# Patient Record
Sex: Female | Born: 1995 | Race: Black or African American | Hispanic: No | Marital: Married
Health system: Southern US, Community
[De-identification: ages and names within clinical notes are randomized; demographics above are authoritative.]

## PROBLEM LIST (undated history)

## (undated) DIAGNOSIS — K219 Gastro-esophageal reflux disease without esophagitis: Secondary | ICD-10-CM

---
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
## 2016-05-15 DIAGNOSIS — H04123 Dry eye syndrome of bilateral lacrimal glands: Secondary | ICD-10-CM | POA: Diagnosis not present

## 2016-05-15 DIAGNOSIS — G44219 Episodic tension-type headache, not intractable: Secondary | ICD-10-CM | POA: Diagnosis not present

## 2016-05-30 DIAGNOSIS — H578 Other specified disorders of eye and adnexa: Secondary | ICD-10-CM | POA: Diagnosis not present

## 2016-07-07 DIAGNOSIS — Z23 Encounter for immunization: Secondary | ICD-10-CM | POA: Diagnosis not present

## 2016-07-07 DIAGNOSIS — N946 Dysmenorrhea, unspecified: Secondary | ICD-10-CM | POA: Diagnosis not present

## 2016-07-07 DIAGNOSIS — J309 Allergic rhinitis, unspecified: Secondary | ICD-10-CM | POA: Diagnosis not present

## 2016-09-03 DIAGNOSIS — H04123 Dry eye syndrome of bilateral lacrimal glands: Secondary | ICD-10-CM | POA: Diagnosis not present

## 2016-09-23 DIAGNOSIS — Z23 Encounter for immunization: Secondary | ICD-10-CM | POA: Diagnosis not present

## 2017-01-20 DIAGNOSIS — Z0000 Encounter for general adult medical examination without abnormal findings: Secondary | ICD-10-CM | POA: Diagnosis not present

## 2017-01-22 DIAGNOSIS — Z23 Encounter for immunization: Secondary | ICD-10-CM | POA: Diagnosis not present

## 2017-01-22 DIAGNOSIS — Z0000 Encounter for general adult medical examination without abnormal findings: Secondary | ICD-10-CM | POA: Diagnosis not present

## 2018-01-24 DIAGNOSIS — Z0000 Encounter for general adult medical examination without abnormal findings: Secondary | ICD-10-CM | POA: Diagnosis not present

## 2018-01-26 DIAGNOSIS — Z01419 Encounter for gynecological examination (general) (routine) without abnormal findings: Secondary | ICD-10-CM | POA: Diagnosis not present

## 2018-01-26 DIAGNOSIS — H6692 Otitis media, unspecified, left ear: Secondary | ICD-10-CM | POA: Diagnosis not present

## 2018-01-26 DIAGNOSIS — N946 Dysmenorrhea, unspecified: Secondary | ICD-10-CM | POA: Diagnosis not present

## 2018-01-26 DIAGNOSIS — Z118 Encounter for screening for other infectious and parasitic diseases: Secondary | ICD-10-CM | POA: Diagnosis not present

## 2018-01-26 DIAGNOSIS — Z6836 Body mass index (BMI) 36.0-36.9, adult: Secondary | ICD-10-CM | POA: Diagnosis not present

## 2018-01-26 DIAGNOSIS — N760 Acute vaginitis: Secondary | ICD-10-CM | POA: Diagnosis not present

## 2018-01-26 DIAGNOSIS — Z23 Encounter for immunization: Secondary | ICD-10-CM | POA: Diagnosis not present

## 2018-01-26 DIAGNOSIS — J309 Allergic rhinitis, unspecified: Secondary | ICD-10-CM | POA: Diagnosis not present

## 2018-01-26 DIAGNOSIS — Z0000 Encounter for general adult medical examination without abnormal findings: Secondary | ICD-10-CM | POA: Diagnosis not present

## 2018-05-19 DIAGNOSIS — N939 Abnormal uterine and vaginal bleeding, unspecified: Secondary | ICD-10-CM | POA: Diagnosis not present

## 2018-05-19 DIAGNOSIS — Z6835 Body mass index (BMI) 35.0-35.9, adult: Secondary | ICD-10-CM | POA: Diagnosis not present

## 2018-06-11 DIAGNOSIS — H40033 Anatomical narrow angle, bilateral: Secondary | ICD-10-CM | POA: Diagnosis not present

## 2018-06-11 DIAGNOSIS — H04123 Dry eye syndrome of bilateral lacrimal glands: Secondary | ICD-10-CM | POA: Diagnosis not present

## 2018-08-18 DIAGNOSIS — N946 Dysmenorrhea, unspecified: Secondary | ICD-10-CM | POA: Diagnosis not present

## 2018-08-18 DIAGNOSIS — Z719 Counseling, unspecified: Secondary | ICD-10-CM | POA: Diagnosis not present

## 2019-03-10 ENCOUNTER — Other Ambulatory Visit: Payer: Self-pay | Admitting: Family Medicine

## 2019-03-10 DIAGNOSIS — K8020 Calculus of gallbladder without cholecystitis without obstruction: Secondary | ICD-10-CM

## 2019-04-19 DIAGNOSIS — Z20828 Contact with and (suspected) exposure to other viral communicable diseases: Secondary | ICD-10-CM | POA: Diagnosis not present

## 2019-04-19 DIAGNOSIS — U071 COVID-19: Secondary | ICD-10-CM | POA: Diagnosis not present

## 2019-04-19 DIAGNOSIS — Z119 Encounter for screening for infectious and parasitic diseases, unspecified: Secondary | ICD-10-CM | POA: Diagnosis not present

## 2019-04-24 ENCOUNTER — Other Ambulatory Visit: Payer: Commercial Managed Care - HMO

## 2019-05-01 DIAGNOSIS — Z6836 Body mass index (BMI) 36.0-36.9, adult: Secondary | ICD-10-CM | POA: Diagnosis not present

## 2019-05-01 DIAGNOSIS — U071 COVID-19: Secondary | ICD-10-CM | POA: Diagnosis not present

## 2019-05-19 DIAGNOSIS — N946 Dysmenorrhea, unspecified: Secondary | ICD-10-CM | POA: Diagnosis not present

## 2019-05-19 DIAGNOSIS — F3281 Premenstrual dysphoric disorder: Secondary | ICD-10-CM | POA: Diagnosis not present

## 2019-05-19 DIAGNOSIS — K219 Gastro-esophageal reflux disease without esophagitis: Secondary | ICD-10-CM | POA: Diagnosis not present

## 2019-07-29 DIAGNOSIS — Z0000 Encounter for general adult medical examination without abnormal findings: Secondary | ICD-10-CM | POA: Diagnosis not present

## 2019-07-29 DIAGNOSIS — Z0184 Encounter for antibody response examination: Secondary | ICD-10-CM | POA: Diagnosis not present

## 2019-07-29 DIAGNOSIS — Z13220 Encounter for screening for lipoid disorders: Secondary | ICD-10-CM | POA: Diagnosis not present

## 2019-08-02 ENCOUNTER — Other Ambulatory Visit: Payer: Self-pay | Admitting: Family Medicine

## 2019-08-02 DIAGNOSIS — R109 Unspecified abdominal pain: Secondary | ICD-10-CM

## 2019-08-02 DIAGNOSIS — Z0000 Encounter for general adult medical examination without abnormal findings: Secondary | ICD-10-CM | POA: Diagnosis not present

## 2019-08-02 DIAGNOSIS — Z01419 Encounter for gynecological examination (general) (routine) without abnormal findings: Secondary | ICD-10-CM | POA: Diagnosis not present

## 2019-08-02 DIAGNOSIS — R7989 Other specified abnormal findings of blood chemistry: Secondary | ICD-10-CM

## 2019-08-02 DIAGNOSIS — R1030 Lower abdominal pain, unspecified: Secondary | ICD-10-CM | POA: Diagnosis not present

## 2019-08-02 DIAGNOSIS — Z6835 Body mass index (BMI) 35.0-35.9, adult: Secondary | ICD-10-CM | POA: Diagnosis not present

## 2019-08-08 ENCOUNTER — Ambulatory Visit
Admission: RE | Admit: 2019-08-08 | Discharge: 2019-08-08 | Disposition: A | Discharge status: Home / Self Care | Payer: BLUE CROSS/BLUE SHIELD | Source: Ambulatory Visit | Attending: Family Medicine | Admitting: Family Medicine

## 2019-08-08 DIAGNOSIS — R109 Unspecified abdominal pain: Secondary | ICD-10-CM

## 2019-08-08 DIAGNOSIS — K8020 Calculus of gallbladder without cholecystitis without obstruction: Secondary | ICD-10-CM | POA: Diagnosis not present

## 2019-08-08 DIAGNOSIS — R7989 Other specified abnormal findings of blood chemistry: Secondary | ICD-10-CM

## 2019-08-11 DIAGNOSIS — K219 Gastro-esophageal reflux disease without esophagitis: Secondary | ICD-10-CM | POA: Diagnosis not present

## 2019-08-11 DIAGNOSIS — R945 Abnormal results of liver function studies: Secondary | ICD-10-CM | POA: Diagnosis not present

## 2019-08-11 DIAGNOSIS — R1030 Lower abdominal pain, unspecified: Secondary | ICD-10-CM | POA: Diagnosis not present

## 2019-08-31 DIAGNOSIS — K8010 Calculus of gallbladder with chronic cholecystitis without obstruction: Secondary | ICD-10-CM | POA: Diagnosis not present

## 2019-10-09 NOTE — Patient Instructions (Signed)
DUE TO COVID-19 ONLY ONE VISITOR IS ALLOWED TO COME WITH YOU AND STAY IN THE WAITING ROOM ONLY DURING PRE OP AND PROCEDURE DAY OF SURGERY. THE 1 VISITOR MAY VISIT WITH YOU AFTER SURGERY IN YOUR PRIVATE ROOM DURING VISITING HOURS ONLY!  YOU NEED TO HAVE A COVID 19 TEST ON__6/29_____ @_10 :45______, THIS TEST MUST BE DONE BEFORE SURGERY, COME  [address], [place] , [address].  ([place]) ONCE YOUR COVID TEST IS COMPLETED, PLEASE BEGIN THE QUARANTINE INSTRUCTIONS AS OUTLINED IN YOUR HANDOUT.                Kaena Stamour    Your procedure is scheduled on: 10/20/19   Report to [facility] Main  Entrance   Report to admitting at 7:30 AM     Call this number if you have problems the morning of surgery [phone]    Remember: Do not eat food or drink liquids :After Midnight.   BRUSH YOUR TEETH MORNING OF SURGERY AND RINSE YOUR MOUTH OUT, NO CHEWING GUM CANDY OR MINTS.     Take these medicines the morning of surgery with A SIP OF WATER: None                                 You may not have any metal on your body including hair pins and              piercings  Do not wear jewelry, make-up, lotions, powders or perfumes, deodorant             Do not wear nail polish on your fingernails.  Do not shave  48 hours prior to surgery.                 Do not bring valuables to the hospital. [place].  Contacts, dentures or bridgework may not be worn into surgery.      Patients discharged the day of surgery will not be allowed to drive home.  IF YOU ARE HAVING SURGERY AND GOING HOME THE SAME DAY, YOU MUST HAVE AN ADULT TO DRIVE YOU HOME AND BE WITH YOU FOR 24 HOURS  YOU MAY GO HOME BY TAXI OR UBER OR ORTHERWISE, BUT AN ADULT MUST ACCOMPANY YOU HOME AND STAY WITH YOU FOR 24 HOURS.  Name and phone number of your driver:  Special Instructions: N/A              Please read over the following fact sheets you were  given: _____________________________________________________________________             [facility] - Preparing for Surgery Before surgery, you can play an important role.   Because skin is not sterile, your skin needs to be as free of germs as possible .  You can reduce the number of germs on your skin by washing with CHG (chlorahexidine gluconate) soap before surgery .  CHG is an antiseptic cleaner which kills germs and bonds with the skin to continue killing germs even after washing. Please DO NOT use if you have an allergy to CHG or antibacterial soaps.   If your skin becomes reddened/irritated stop using the CHG and inform your nurse when you arrive at Short Stay. Do not shave (including legs and underarms) for at least 48 hours prior to  the first CHG shower.   Please follow these instructions carefully:  1.  Shower with CHG Soap the night before surgery and the  morning of Surgery.  2.  If you choose to wash your hair, wash your hair first as usual with your  normal  shampoo.  3.  After you shampoo, rinse your hair and body thoroughly to remove the  shampoo.                                        4.  Use CHG as you would any other liquid soap.  You can apply chg directly  to the skin and wash                       Gently with a scrungie or clean washcloth.  5.  Apply the CHG Soap to your body ONLY FROM THE NECK DOWN.   Do not use on face/ open                           Wound or open sores. Avoid contact with eyes, ears mouth and genitals (private parts).                       Wash face,  Genitals (private parts) with your normal soap.             6.  Wash thoroughly, paying special attention to the area where your surgery  will be performed.  7.  Thoroughly rinse your body with warm water from the neck down.  8.  DO NOT shower/wash with your normal soap after using and rinsing off  the CHG Soap.             9.  Pat yourself dry with a clean towel.            10.  Wear clean  pajamas.            11.  Place clean sheets on your bed the night of your first shower and do not  sleep with pets. Day of Surgery : Do not apply any lotions/deodorants the morning of surgery.  Please wear clean clothes to the hospital/surgery center.  FAILURE TO FOLLOW THESE INSTRUCTIONS MAY RESULT IN THE CANCELLATION OF YOUR SURGERY PATIENT SIGNATURE_________________________________  NURSE SIGNATURE__________________________________  ________________________________________________________________________

## 2019-10-10 ENCOUNTER — Ambulatory Visit (HOSPITAL_COMMUNITY): Payer: Self-pay | Admitting: Surgery

## 2019-10-10 ENCOUNTER — Encounter (HOSPITAL_COMMUNITY)
Admission: RE | Admit: 2019-10-10 | Discharge: 2019-10-10 | Disposition: A | Discharge status: Home / Self Care | Payer: BC Managed Care – PPO | Source: Ambulatory Visit | Attending: Surgery | Admitting: Surgery

## 2019-10-10 ENCOUNTER — Encounter (HOSPITAL_COMMUNITY): Payer: Self-pay | Admitting: Surgery

## 2019-10-10 ENCOUNTER — Other Ambulatory Visit: Payer: Self-pay

## 2019-10-10 NOTE — Progress Notes (Signed)
Patient is a Jehovah Witness.

## 2019-10-10 NOTE — Progress Notes (Signed)
Spoke with Triage at [facility] and they will inform Dr Daphine Deutscher to place preop orders.

## 2019-10-17 ENCOUNTER — Other Ambulatory Visit: Payer: Self-pay

## 2019-10-17 ENCOUNTER — Other Ambulatory Visit (HOSPITAL_COMMUNITY)
Admission: RE | Admit: 2019-10-17 | Discharge: 2019-10-17 | Disposition: A | Discharge status: Home / Self Care | Payer: BC Managed Care – PPO | Source: Ambulatory Visit | Attending: Surgery | Admitting: Surgery

## 2019-10-17 ENCOUNTER — Encounter (HOSPITAL_COMMUNITY)
Admission: RE | Admit: 2019-10-17 | Discharge: 2019-10-17 | Disposition: A | Discharge status: Home / Self Care | Payer: BC Managed Care – PPO | Source: Ambulatory Visit | Attending: Surgery | Admitting: Surgery

## 2019-10-17 DIAGNOSIS — Z01812 Encounter for preprocedural laboratory examination: Secondary | ICD-10-CM | POA: Diagnosis not present

## 2019-10-17 DIAGNOSIS — Z20822 Contact with and (suspected) exposure to covid-19: Secondary | ICD-10-CM | POA: Insufficient documentation

## 2019-10-17 LAB — CBC
HCT: 41.8 % (ref 36.0–46.0)
Hemoglobin: 13.9 g/dL (ref 12.0–15.0)
MCH: 28 pg (ref 26.0–34.0)
MCHC: 33.3 g/dL (ref 30.0–36.0)
MCV: 84.1 fL (ref 80.0–100.0)
Platelets: 196 10*3/uL (ref 150–400)
RBC: 4.97 MIL/uL (ref 3.87–5.11)
RDW: 12.7 % (ref 11.5–15.5)
WBC: 7 10*3/uL (ref 4.0–10.5)
nRBC: 0 % (ref 0.0–0.2)

## 2019-10-17 LAB — SARS CORONAVIRUS 2 (TAT 6-24 HRS): SARS Coronavirus 2: NEGATIVE

## 2019-10-18 ENCOUNTER — Encounter (HOSPITAL_COMMUNITY): Payer: Self-pay | Admitting: Emergency Medicine

## 2019-10-19 MED ORDER — BUPIVACAINE LIPOSOME 1.3 % IJ SUSP
20.0000 mL | Freq: Once | INTRAMUSCULAR | Status: DC
  Filled 2019-10-19: qty 20

## 2019-10-19 NOTE — H&P (Signed)
Tonya Moyer Appointment: 08/31/2019 9:30 AM Location: [place] Office Patient #: [id number] DOB: 02/24/1996 Married / Language: English / Race: Black or African American Female   History of Present Illness The patient is a 24 year old female who presents for evaluation of gall stones. 24 year old lady who works at [employer] to [place] has had several attacks of RUQ and back pain at night. Ultrasound showed gallstones. I gave her a copy of her ultrasound. I gave her a Krames booklet after I explained the lap chole in detail. She is aware of the complications not limited to CBD injury, bleeding, bowel injury or bile leaks.   She wants to get this done. SHE DOES NOT TAKE BLOOD PRODUCTS. Will schedule lap chole with IOC at [facility].    Past Surgical History No pertinent past surgical history   Diagnostic Studies History Mammogram  never Pap Smear  1-5 years ago  Allergies Doristine Devoid, CMA; 08/31/2019 9:21 AM) Penicillins   Medication History Doristine Devoid, CMA; 08/31/2019 9:21 AM) Omeprazole (40MG  Capsule DR, Oral) Active. Allergy (10MG  Tablet, Oral) Active. Medications Reconciled  Social History Alcohol use  Moderate alcohol use. Caffeine use  Carbonated beverages, Coffee, Tea. No drug use  Tobacco use  Never smoker.  Family History Arthritis  Mother. Bleeding disorder  Mother. Depression  Brother. Hypertension  Mother. Thyroid problems  Mother.  Pregnancy / Birth History Age at menarche  13 years. Gravida  0 Para  0 Regular periods   Other Problems Cholelithiasis  Gastroesophageal Reflux Disease     Review of SystemsGeneral Present- Appetite Loss and Fatigue. Not Present- Chills, Fever, Night Sweats, Weight Gain and Weight Loss. Skin Not Present- Change in Wart/Mole, Dryness, Hives, Jaundice, New Lesions, Non-Healing Wounds, Rash and Ulcer. HEENT Present- Earache, Ringing in the Ears, Seasonal Allergies and Wears glasses/contact lenses. Not  Present- Hearing Loss, Hoarseness, Nose Bleed, Oral Ulcers, Sinus Pain, Sore Throat, Visual Disturbances and Yellow Eyes. Respiratory Not Present- Bloody sputum, Chronic Cough, Difficulty Breathing, Snoring and Wheezing. Breast Not Present- Breast Mass, Breast Pain, Nipple Discharge and Skin Changes. Cardiovascular Not Present- Chest Pain, Difficulty Breathing Lying Down, Leg Cramps, Palpitations, Rapid Heart Rate, Shortness of Breath and Swelling of Extremities. Gastrointestinal Present- Abdominal Pain, Bloating, Constipation, Excessive gas and Gets full quickly at meals. Not Present- Bloody Stool, Change in Bowel Habits, Chronic diarrhea, Difficulty Swallowing, Hemorrhoids, Indigestion, Nausea, Rectal Pain and Vomiting. Female Genitourinary Not Present- Frequency, Nocturia, Painful Urination, Pelvic Pain and Urgency. Musculoskeletal Not Present- Back Pain, Joint Pain, Joint Stiffness, Muscle Pain, Muscle Weakness and Swelling of Extremities. Neurological Not Present- Decreased Memory, Fainting, Headaches, Numbness, Seizures, Tingling, Tremor, Trouble walking and Weakness. Psychiatric Not Present- Anxiety, Bipolar, Change in Sleep Pattern, Depression, Fearful and Frequent crying. Endocrine Not Present- Cold Intolerance, Excessive Hunger, Hair Changes, Heat Intolerance, Hot flashes and New Diabetes. Hematology Not Present- Blood Thinners, Easy Bruising, Excessive bleeding, Gland problems, HIV and Persistent Infections.  Vitals  08/31/2019 9:20 AM Weight: 226.6 lb Height: 64in Body Surface Area: 2.06 m Body Mass Index: 38.9 kg/m  Temp.: 36F  BP: 118/74(Sitting, Left Arm, Standard)   Physical Exam General Note: WDF NAD HEENT unremarkable Neck supple Chest clear Heart SR Abdomen no pain at present Ext no hx of DVT     Assessment & PlanCHRONIC CHOLECYSTITIS WITH CALCULUS (K80.10) Impression: Plan lap chole with IOC at [facility]. Informed consent completed. I spent over 30 min in  assessment and review for her.  Matt B. 09/02/2019, MD, FACS

## 2019-10-20 ENCOUNTER — Ambulatory Visit (HOSPITAL_COMMUNITY): Payer: BC Managed Care – PPO | Admitting: Registered Nurse

## 2019-10-20 ENCOUNTER — Ambulatory Visit (HOSPITAL_COMMUNITY): Payer: BC Managed Care – PPO

## 2019-10-20 ENCOUNTER — Ambulatory Visit (HOSPITAL_COMMUNITY)
Admission: RE | Admit: 2019-10-20 | Discharge: 2019-10-20 | Disposition: A | Discharge status: Home / Self Care | Payer: BC Managed Care – PPO | Attending: Surgery | Admitting: Surgery

## 2019-10-20 ENCOUNTER — Encounter (HOSPITAL_COMMUNITY): Payer: Self-pay | Admitting: Surgery

## 2019-10-20 ENCOUNTER — Encounter (HOSPITAL_COMMUNITY)
Admission: RE | Disposition: A | Discharge status: Home / Self Care | Payer: Self-pay | Source: Home / Self Care | Attending: Surgery

## 2019-10-20 DIAGNOSIS — K8010 Calculus of gallbladder with chronic cholecystitis without obstruction: Secondary | ICD-10-CM | POA: Diagnosis not present

## 2019-10-20 DIAGNOSIS — Z79899 Other long term (current) drug therapy: Secondary | ICD-10-CM | POA: Insufficient documentation

## 2019-10-20 DIAGNOSIS — K219 Gastro-esophageal reflux disease without esophagitis: Secondary | ICD-10-CM | POA: Insufficient documentation

## 2019-10-20 DIAGNOSIS — Z9049 Acquired absence of other specified parts of digestive tract: Secondary | ICD-10-CM

## 2019-10-20 DIAGNOSIS — K811 Chronic cholecystitis: Secondary | ICD-10-CM | POA: Diagnosis not present

## 2019-10-20 DIAGNOSIS — Z8719 Personal history of other diseases of the digestive system: Secondary | ICD-10-CM | POA: Diagnosis not present

## 2019-10-20 DIAGNOSIS — K8020 Calculus of gallbladder without cholecystitis without obstruction: Secondary | ICD-10-CM

## 2019-10-20 HISTORY — DX: Gastro-esophageal reflux disease without esophagitis: K21.9

## 2019-10-20 HISTORY — PX: CHOLECYSTECTOMY: SHX55

## 2019-10-20 LAB — PREGNANCY, URINE: Preg Test, Ur: NEGATIVE

## 2019-10-20 SURGERY — LAPAROSCOPIC CHOLECYSTECTOMY WITH INTRAOPERATIVE CHOLANGIOGRAM
Anesthesia: General | Site: Abdomen

## 2019-10-20 MED ORDER — LACTATED RINGERS IV SOLN
INTRAVENOUS | Status: DC | PRN
  Administered 2019-10-20: 1000 mL

## 2019-10-20 MED ORDER — PROPOFOL 10 MG/ML IV BOLUS
INTRAVENOUS | Status: DC | PRN
  Administered 2019-10-20: 200 mg via INTRAVENOUS

## 2019-10-20 MED ORDER — ACETAMINOPHEN 160 MG/5ML PO SOLN: 325.0000 mg | ORAL | Status: DC | PRN

## 2019-10-20 MED ORDER — ONDANSETRON HCL 4 MG/2ML IJ SOLN
INTRAMUSCULAR | Status: DC | PRN
  Administered 2019-10-20: 4 mg via INTRAVENOUS

## 2019-10-20 MED ORDER — ACETAMINOPHEN 500 MG PO TABS
1000.0000 mg | ORAL_TABLET | ORAL | Status: AC
  Administered 2019-10-20: 1000 mg via ORAL
  Filled 2019-10-20: qty 2

## 2019-10-20 MED ORDER — CHLORHEXIDINE GLUCONATE CLOTH 2 % EX PADS: 6.0000 | MEDICATED_PAD | Freq: Once | CUTANEOUS | Status: DC

## 2019-10-20 MED ORDER — OXYCODONE HCL 5 MG PO TABS: 5.0000 mg | ORAL_TABLET | Freq: Once | ORAL | Status: DC | PRN

## 2019-10-20 MED ORDER — CEFAZOLIN SODIUM-DEXTROSE 2-4 GM/100ML-% IV SOLN
2.0000 g | INTRAVENOUS | Status: AC
  Administered 2019-10-20: 2 g via INTRAVENOUS
  Filled 2019-10-20: qty 100

## 2019-10-20 MED ORDER — IOHEXOL 300 MG/ML  SOLN
INTRAMUSCULAR | Status: DC | PRN
  Administered 2019-10-20: 5 mL

## 2019-10-20 MED ORDER — HEPARIN SODIUM (PORCINE) 5000 UNIT/ML IJ SOLN
5000.0000 [IU] | Freq: Once | INTRAMUSCULAR | Status: AC
  Administered 2019-10-20: 5000 [IU] via SUBCUTANEOUS
  Filled 2019-10-20: qty 1

## 2019-10-20 MED ORDER — OXYCODONE HCL 5 MG/5ML PO SOLN: 5.0000 mg | Freq: Once | ORAL | Status: DC | PRN

## 2019-10-20 MED ORDER — CHLORHEXIDINE GLUCONATE 0.12 % MT SOLN
15.0000 mL | Freq: Once | OROMUCOSAL | Status: AC
  Administered 2019-10-20: 15 mL via OROMUCOSAL

## 2019-10-20 MED ORDER — 0.9 % SODIUM CHLORIDE (POUR BTL) OPTIME
TOPICAL | Status: DC | PRN
  Administered 2019-10-20: 1000 mL

## 2019-10-20 MED ORDER — ACETAMINOPHEN 325 MG PO TABS: 325.0000 mg | ORAL_TABLET | ORAL | Status: DC | PRN

## 2019-10-20 MED ORDER — ROCURONIUM BROMIDE 10 MG/ML (PF) SYRINGE
PREFILLED_SYRINGE | INTRAVENOUS | Status: DC | PRN
  Administered 2019-10-20: 10 mg via INTRAVENOUS
  Administered 2019-10-20: 70 mg via INTRAVENOUS

## 2019-10-20 MED ORDER — BUPIVACAINE LIPOSOME 1.3 % IJ SUSP
INTRAMUSCULAR | Status: DC | PRN
  Administered 2019-10-20: 20 mL

## 2019-10-20 MED ORDER — HYDROCODONE-ACETAMINOPHEN 5-325 MG PO TABS
1.0000 | ORAL_TABLET | Freq: Four times a day (QID) | ORAL | 0 refills | Status: AC | PRN
Start: 2019-10-20 — End: ?

## 2019-10-20 MED ORDER — HYDROCODONE-ACETAMINOPHEN 5-325 MG PO TABS
1.0000 | ORAL_TABLET | Freq: Once | ORAL | Status: AC
  Administered 2019-10-20: 1 via ORAL

## 2019-10-20 MED ORDER — MEPERIDINE HCL 50 MG/ML IJ SOLN: 6.2500 mg | INTRAMUSCULAR | Status: DC | PRN

## 2019-10-20 MED ORDER — SUGAMMADEX SODIUM 200 MG/2ML IV SOLN
INTRAVENOUS | Status: DC | PRN
  Administered 2019-10-20: 200 mg via INTRAVENOUS

## 2019-10-20 MED ORDER — ONDANSETRON HCL 4 MG/2ML IJ SOLN: 4.0000 mg | Freq: Once | INTRAMUSCULAR | Status: DC | PRN

## 2019-10-20 MED ORDER — MIDAZOLAM HCL 5 MG/5ML IJ SOLN
INTRAMUSCULAR | Status: DC | PRN
  Administered 2019-10-20: 2 mg via INTRAVENOUS

## 2019-10-20 MED ORDER — LIDOCAINE 2% (20 MG/ML) 5 ML SYRINGE
INTRAMUSCULAR | Status: DC | PRN
  Administered 2019-10-20: 100 mg via INTRAVENOUS

## 2019-10-20 MED ORDER — SCOPOLAMINE 1 MG/3DAYS TD PT72
1.0000 | MEDICATED_PATCH | TRANSDERMAL | Status: DC
  Administered 2019-10-20: 1.5 mg via TRANSDERMAL
  Filled 2019-10-20: qty 1

## 2019-10-20 MED ORDER — ORAL CARE MOUTH RINSE: 15.0000 mL | Freq: Once | OROMUCOSAL | Status: AC

## 2019-10-20 MED ORDER — FENTANYL CITRATE (PF) 250 MCG/5ML IJ SOLN
INTRAMUSCULAR | Status: DC | PRN
  Administered 2019-10-20: 150 ug via INTRAVENOUS
  Administered 2019-10-20 (×2): 50 ug via INTRAVENOUS

## 2019-10-20 MED ORDER — DEXAMETHASONE SODIUM PHOSPHATE 10 MG/ML IJ SOLN
INTRAMUSCULAR | Status: DC | PRN
  Administered 2019-10-20: 10 mg via INTRAVENOUS

## 2019-10-20 MED ORDER — HYDROCODONE-ACETAMINOPHEN 5-325 MG PO TABS
ORAL_TABLET | ORAL | Status: AC
  Filled 2019-10-20: qty 1

## 2019-10-20 MED ORDER — FENTANYL CITRATE (PF) 100 MCG/2ML IJ SOLN
25.0000 ug | INTRAMUSCULAR | Status: DC | PRN
  Administered 2019-10-20: 50 ug via INTRAVENOUS

## 2019-10-20 MED ORDER — LACTATED RINGERS IV SOLN: INTRAVENOUS | Status: DC

## 2019-10-20 MED ORDER — FENTANYL CITRATE (PF) 100 MCG/2ML IJ SOLN
INTRAMUSCULAR | Status: AC
  Filled 2019-10-20: qty 2

## 2019-10-20 MED ORDER — KETOROLAC TROMETHAMINE 30 MG/ML IJ SOLN
30.0000 mg | Freq: Once | INTRAMUSCULAR | Status: AC | PRN
  Administered 2019-10-20: 30 mg via INTRAVENOUS

## 2019-10-20 MED ORDER — KETOROLAC TROMETHAMINE 30 MG/ML IJ SOLN
INTRAMUSCULAR | Status: AC
  Filled 2019-10-20: qty 1

## 2019-10-20 SURGICAL SUPPLY — 41 items
APPLICATOR COTTON TIP 6 STRL (MISCELLANEOUS) IMPLANT
APPLICATOR COTTON TIP 6IN STRL (MISCELLANEOUS)
APPLIER CLIP 5 13 M/L LIGAMAX5 (MISCELLANEOUS)
APPLIER CLIP ROT 10 11.4 M/L (STAPLE) ×3
BENZOIN TINCTURE PRP APPL 2/3 (GAUZE/BANDAGES/DRESSINGS) IMPLANT
CABLE HIGH FREQUENCY MONO STRZ (ELECTRODE) IMPLANT
CATH REDDICK CHOLANGI 4FR 50CM (CATHETERS) ×3 IMPLANT
CLIP APPLIE 5 13 M/L LIGAMAX5 (MISCELLANEOUS) IMPLANT
CLIP APPLIE ROT 10 11.4 M/L (STAPLE) ×1 IMPLANT
CLOSURE WOUND 1/2 X4 (GAUZE/BANDAGES/DRESSINGS)
COVER MAYO STAND STRL (DRAPES) ×3 IMPLANT
COVER SURGICAL LIGHT HANDLE (MISCELLANEOUS) ×3 IMPLANT
COVER WAND RF STERILE (DRAPES) IMPLANT
DECANTER SPIKE VIAL GLASS SM (MISCELLANEOUS) ×3 IMPLANT
DERMABOND ADVANCED (GAUZE/BANDAGES/DRESSINGS) ×2
DERMABOND ADVANCED .7 DNX12 (GAUZE/BANDAGES/DRESSINGS) ×1 IMPLANT
DRAPE C-ARM 42X120 X-RAY (DRAPES) ×3 IMPLANT
ELECT L-HOOK LAP 45CM DISP (ELECTROSURGICAL) ×3
ELECT PENCIL ROCKER SW 15FT (MISCELLANEOUS) IMPLANT
ELECT REM PT RETURN 15FT ADLT (MISCELLANEOUS) ×3 IMPLANT
ELECTRODE L-HOOK LAP 45CM DISP (ELECTROSURGICAL) ×1 IMPLANT
GLOVE BIOGEL M 8.0 STRL (GLOVE) ×3 IMPLANT
GOWN STRL REUS W/TWL XL LVL3 (GOWN DISPOSABLE) ×3 IMPLANT
HEMOSTAT SURGICEL 4X8 (HEMOSTASIS) IMPLANT
IV CATH 14GX2 1/4 (CATHETERS) ×3 IMPLANT
KIT BASIN (CUSTOM PROCEDURE TRAY) ×3 IMPLANT
KIT TURNOVER KIT A (KITS) IMPLANT
POUCH RETRIEVAL ECOSAC 10 (ENDOMECHANICALS) ×1 IMPLANT
POUCH RETRIEVAL ECOSAC 10MM (ENDOMECHANICALS) ×3
SCISSORS LAP 5X45 EPIX DISP (ENDOMECHANICALS) ×3 IMPLANT
SET IRRIG TUBING LAPAROSCOPIC (IRRIGATION / IRRIGATOR) ×3 IMPLANT
SET TUBE SMOKE EVAC HIGH FLOW (TUBING) ×3 IMPLANT
SLEEVE XCEL OPT CAN 5 100 (ENDOMECHANICALS) ×3 IMPLANT
STRIP CLOSURE SKIN 1/2X4 (GAUZE/BANDAGES/DRESSINGS) IMPLANT
SUT MNCRL AB 4-0 PS2 18 (SUTURE) ×6 IMPLANT
SYR 20ML LL LF (SYRINGE) ×3 IMPLANT
TOWEL OR 17X26 10 PK STRL BLUE (TOWEL DISPOSABLE) ×3 IMPLANT
TRAY LAPAROSCOPIC (CUSTOM PROCEDURE TRAY) ×3 IMPLANT
TROCAR BLADELESS OPT 5 100 (ENDOMECHANICALS) ×3 IMPLANT
TROCAR XCEL BLUNT TIP 100MML (ENDOMECHANICALS) IMPLANT
TROCAR XCEL NON-BLD 11X100MML (ENDOMECHANICALS) ×3 IMPLANT

## 2019-10-20 NOTE — Discharge Instructions (Signed)
Laparoscopic Cholecystectomy Laparoscopic cholecystectomy is surgery to remove the gallbladder. The gallbladder is a pear-shaped organ that lies beneath the liver on the right side of the body. The gallbladder stores bile, which is a fluid that helps the body to digest fats. Cholecystectomy is often done for inflammation of the gallbladder (cholecystitis). This condition is usually caused by a buildup of gallstones (cholelithiasis) in the gallbladder. Gallstones can block the flow of bile, which can result in inflammation and pain. In severe cases, emergency surgery may be required. This procedure is done though Packard incisions in your abdomen (laparoscopic surgery). A thin scope with a camera (laparoscope) is inserted through one incision. Thin surgical instruments are inserted through the other incisions. In some cases, a laparoscopic procedure may be turned into a type of surgery that is done through a larger incision (open surgery). Tell a health care provider about:  Any allergies you have.  All medicines you are taking, including vitamins, herbs, eye drops, creams, and over-the-counter medicines.  Any problems you or family members have had with anesthetic medicines.  Any blood disorders you have.  Any surgeries you have had.  Any medical conditions you have.  Whether you are pregnant or may be pregnant. What are the risks? Generally, this is a safe procedure. However, problems may occur, including:  Infection.  Bleeding.  Allergic reactions to medicines.  Damage to other structures or organs.  A stone remaining in the common bile duct. The common bile duct carries bile from the gallbladder into the Aaron intestine.  A bile leak from the cyst duct that is clipped when your gallbladder is removed. What happens before the procedure? Staying hydrated Follow instructions from your health care provider about hydration, which may include:  Up to 2 hours before the procedure - you  may continue to drink clear liquids, such as water, clear fruit juice, black coffee, and plain tea. Eating and drinking restrictions Follow instructions from your health care provider about eating and drinking, which may include:  8 hours before the procedure - stop eating heavy meals or foods such as meat, fried foods, or fatty foods.  6 hours before the procedure - stop eating light meals or foods, such as toast or cereal.  6 hours before the procedure - stop drinking milk or drinks that contain milk.  2 hours before the procedure - stop drinking clear liquids. Medicines  Ask your health care provider about: ? Changing or stopping your regular medicines. This is especially important if you are taking diabetes medicines or blood thinners. ? Taking medicines such as aspirin and ibuprofen. These medicines can thin your blood. Do not take these medicines before your procedure if your health care provider instructs you not to.  You may be given antibiotic medicine to help prevent infection. General instructions  Let your health care provider know if you develop a cold or an infection before surgery.  Plan to have someone take you home from the hospital or clinic.  Ask your health care provider how your surgical site will be marked or identified. What happens during the procedure?   To reduce your risk of infection: ? Your health care team will wash or sanitize their hands. ? Your skin will be washed with soap. ? Hair may be removed from the surgical area.  An IV tube may be inserted into one of your veins.  You will be given one or more of the following: ? A medicine to help you relax (sedative). ? A   medicine to make you fall asleep (general anesthetic).  A breathing tube will be placed in your mouth.  Your surgeon will make several Gabay cuts (incisions) in your abdomen.  The laparoscope will be inserted through one of the Wuest incisions. The camera on the laparoscope will  send images to a TV screen (monitor) in the operating room. This lets your surgeon see inside your abdomen.  Air-like gas will be pumped into your abdomen. This will expand your abdomen to give the surgeon more room to perform the surgery.  Other tools that are needed for the procedure will be inserted through the other incisions. The gallbladder will be removed through one of the incisions.  Your common bile duct may be examined. If stones are found in the common bile duct, they may be removed.  After your gallbladder has been removed, the incisions will be closed with stitches (sutures), staples, or skin glue.  Your incisions may be covered with a bandage (dressing). The procedure may vary among health care providers and hospitals. What happens after the procedure?  Your blood pressure, heart rate, breathing rate, and blood oxygen level will be monitored until the medicines you were given have worn off.  You will be given medicines as needed to control your pain.  Do not drive for 24 hours if you were given a sedative. This information is not intended to replace advice given to you by your health care provider. Make sure you discuss any questions you have with your health care provider. Document Revised: 03/19/2017 Document Reviewed: 09/23/2015 Elsevier Patient Education  2020 Elsevier Inc.   General Anesthesia, Adult, Care After This sheet gives you information about how to care for yourself after your procedure. Your health care provider may also give you more specific instructions. If you have problems or questions, contact your health care provider. What can I expect after the procedure? After the procedure, the following side effects are common:  Pain or discomfort at the IV site.  Nausea.  Vomiting.  Sore throat.  Trouble concentrating.  Feeling cold or chills.  Weak or tired.  Sleepiness and fatigue.  Soreness and body aches. These side effects can affect parts  of the body that were not involved in surgery. Follow these instructions at home:  For at least 24 hours after the procedure:  Have a responsible adult stay with you. It is important to have someone help care for you until you are awake and alert.  Rest as needed.  Do not: ? Participate in activities in which you could fall or become injured. ? Drive. ? Use heavy machinery. ? Drink alcohol. ? Take sleeping pills or medicines that cause drowsiness. ? Make important decisions or sign legal documents. ? Take care of children on your own. Eating and drinking  Follow any instructions from your health care provider about eating or drinking restrictions.  When you feel hungry, start by eating Kamrowski amounts of foods that are soft and easy to digest (bland), such as toast. Gradually return to your regular diet.  Drink enough fluid to keep your urine pale yellow.  If you vomit, rehydrate by drinking water, juice, or clear broth. General instructions  If you have sleep apnea, surgery and certain medicines can increase your risk for breathing problems. Follow instructions from your health care provider about wearing your sleep device: ? Anytime you are sleeping, including during daytime naps. ? While taking prescription pain medicines, sleeping medicines, or medicines that make you drowsy.  Return   to your normal activities as told by your health care provider. Ask your health care provider what activities are safe for you.  Take over-the-counter and prescription medicines only as told by your health care provider.  If you smoke, do not smoke without supervision.  Keep all follow-up visits as told by your health care provider. This is important. Contact a health care provider if:  You have nausea or vomiting that does not get better with medicine.  You cannot eat or drink without vomiting.  You have pain that does not get better with medicine.  You are unable to pass urine.  You  develop a skin rash.  You have a fever.  You have redness around your IV site that gets worse. Get help right away if:  You have difficulty breathing.  You have chest pain.  You have blood in your urine or stool, or you vomit blood. Summary  After the procedure, it is common to have a sore throat or nausea. It is also common to feel tired.  Have a responsible adult stay with you for the first 24 hours after general anesthesia. It is important to have someone help care for you until you are awake and alert.  When you feel hungry, start by eating Dase amounts of foods that are soft and easy to digest (bland), such as toast. Gradually return to your regular diet.  Drink enough fluid to keep your urine pale yellow.  Return to your normal activities as told by your health care provider. Ask your health care provider what activities are safe for you. This information is not intended to replace advice given to you by your health care provider. Make sure you discuss any questions you have with your health care provider. Document Revised: 04/09/2017 Document Reviewed: 11/20/2016 Elsevier Patient Education  2020 Elsevier Inc.  

## 2019-10-20 NOTE — Op Note (Signed)
Tonya Moyer  Primary Care Physician:  Lewis Moccasin, MD    10/20/2019  11:30 AM  Procedure: Laparoscopic Cholecystectomy with intraoperative cholangiogram  Surgeon: Susy Frizzle B. Daphine Deutscher, MD, FACS Asst:  Violeta Gelinas, MD, FACS  Anes:  General  Drains:  None  Findings: Chronic cholecystitis with multiple Ackert cholesterol stones; normal IOC  Description of Procedure: The patient was taken to OR 4 and given general anesthesia.  The patient was prepped with chlorohexidine prep and draped sterilely. A time out was performed including identifying the patient and discussing their procedure.  Access to the abdomen was achieved with a 5 mm Optiview through the right upper quadrant.  Port placement included three 5 mm trocars and one 11 mm trocar.    The gallbladder was visualized and appeared attached to surrounding viscera.   The fundus of the gallbaldder was grasped and the gallbladder was elevated. Traction on the infundibulum allowed for successful demonstration of the critical view. Inflammatory changes were chronic and real.  The cystic duct was identified and clipped up on the gallbladder and an incision was made in the cystic duct and the Reddick catheter was inserted after milking the cystic duct of any debris. A dynamic cholangiogram was performed which demonstrated normal intrahepatic anatomy and flow into the duodenum.    The cystic duct was then triple clipped and divided, the cystic artery was double clipped and divided and then the gallbladder was removed from the gallbladder bed. Removal of the gallbladder from the gallbladder bed was performed with a single otomy and the stones were controlled.  The gallbladder was then placed in a bag and brought out through one of the trocar sites. The gallbladder bed was inspected and no bleeding or bile leaks were seen.     Incisions were injected with Exparel and closed with 4-0 Monocryl and Dermabond on the skin.  Sponge and needle count were  correct.    The patient was taken to the recovery room in satisfactory condition.

## 2019-10-20 NOTE — Anesthesia Preprocedure Evaluation (Signed)
Anesthesia Evaluation  Patient identified by MRN, date of birth, ID band Patient awake    Reviewed: Allergy & Precautions, NPO status , Patient's Chart, lab work & pertinent test results  Airway Mallampati: I       Dental no notable dental hx. (+) Teeth Intact   Pulmonary neg pulmonary ROS,    Pulmonary exam normal breath sounds clear to auscultation       Cardiovascular negative cardio ROS Normal cardiovascular exam Rhythm:Regular Rate:Normal     Neuro/Psych negative neurological ROS  negative psych ROS   GI/Hepatic Neg liver ROS, GERD  Medicated and Controlled,  Endo/Other  Morbid obesity  Renal/GU negative Renal ROS  negative genitourinary   Musculoskeletal negative musculoskeletal ROS (+)   Abdominal (+) + obese,   Peds negative pediatric ROS (+)  Hematology negative hematology ROS (+)   Anesthesia Other Findings   Reproductive/Obstetrics negative OB ROS                             Anesthesia Physical Anesthesia Plan  ASA: III  Anesthesia Plan: General   Post-op Pain Management:    Induction: Intravenous  PONV Risk Score and Plan: 4 or greater and Ondansetron, Dexamethasone and Midazolam  Airway Management Planned: Oral ETT  Additional Equipment: None  Intra-op Plan:   Post-operative Plan: Extubation in OR  Informed Consent: I have reviewed the patients History and Physical, chart, labs and discussed the procedure including the risks, benefits and alternatives for the proposed anesthesia with the patient or authorized representative who has indicated his/her understanding and acceptance.     Dental advisory given  Plan Discussed with: CRNA  Anesthesia Plan Comments:         Anesthesia Quick Evaluation

## 2019-10-20 NOTE — Interval H&P Note (Signed)
History and Physical Interval Note:  10/20/2019 9:35 AM  Tonya Moyer  has presented today for surgery, with the diagnosis of CHOLELITHIASIS.  The various methods of treatment have been discussed with the patient and family. After consideration of risks, benefits and other options for treatment, the patient has consented to  Procedure(s): LAPAROSCOPIC CHOLECYSTECTOMY WITH INTRAOPERATIVE CHOLANGIOGRAM (N/A) as a surgical intervention.  The patient's history has been reviewed, patient examined, no change in status, stable for surgery.  I have reviewed the patient's chart and labs.  Questions were answered to the patient's satisfaction.     Valarie Merino

## 2019-10-20 NOTE — Anesthesia Procedure Notes (Signed)
Procedure Name: Intubation Date/Time: 10/20/2019 10:22 AM Performed by: Talbot Grumbling, CRNA Pre-anesthesia Checklist: Emergency Drugs available, Suction available, Patient being monitored and Patient identified Patient Re-evaluated:Patient Re-evaluated prior to induction Oxygen Delivery Method: Circle system utilized Preoxygenation: Pre-oxygenation with 100% oxygen Induction Type: IV induction Ventilation: Mask ventilation without difficulty Laryngoscope Size: Mac and 3 Grade View: Grade I Tube type: Oral Tube size: 7.0 mm Number of attempts: 1 Airway Equipment and Method: Stylet Placement Confirmation: ETT inserted through vocal cords under direct vision,  positive ETCO2 and breath sounds checked- equal and bilateral Secured at: 21 cm Tube secured with: Tape Dental Injury: Teeth and Oropharynx as per pre-operative assessment

## 2019-10-20 NOTE — Transfer of Care (Signed)
Immediate Anesthesia Transfer of Care Note  Patient: Tonya Moyer  Procedure(s) Performed: LAPAROSCOPIC CHOLECYSTECTOMY WITH INTRAOPERATIVE CHOLANGIOGRAM (N/A Abdomen)  Patient Location: PACU  Anesthesia Type:General  Level of Consciousness: sedated  Airway & Oxygen Therapy: Patient Spontanous Breathing and Patient connected to face mask oxygen  Post-op Assessment: Report given to RN and Post -op Vital signs reviewed and stable  Post vital signs: Reviewed and stable  Last Vitals:  Vitals Value Taken Time  BP    Temp    Pulse    Resp    SpO2      Last Pain:  Vitals:   10/20/19 0812  TempSrc: Oral         Complications: No complications documented.

## 2019-10-24 ENCOUNTER — Encounter (HOSPITAL_COMMUNITY): Payer: Self-pay | Admitting: Surgery

## 2019-10-24 LAB — SURGICAL PATHOLOGY

## 2019-10-24 NOTE — Anesthesia Postprocedure Evaluation (Signed)
Anesthesia Post Note  Patient: Adult nurse  Procedure(s) Performed: LAPAROSCOPIC CHOLECYSTECTOMY WITH INTRAOPERATIVE CHOLANGIOGRAM (N/A Abdomen)     Patient location during evaluation: PACU Anesthesia Type: General Level of consciousness: awake Pain management: pain level controlled Vital Signs Assessment: post-procedure vital signs reviewed and stable Respiratory status: spontaneous breathing Cardiovascular status: stable Postop Assessment: no apparent nausea or vomiting Anesthetic complications: no   No complications documented.  Last Vitals:  Vitals:   10/20/19 1239 10/20/19 1326  BP: (!) 140/98 140/90  Pulse: 65 93  Resp: 18   Temp: 36.8 C 36.7 C  SpO2: 97% 99%    Last Pain:  Vitals:   10/20/19 1326  TempSrc:   PainSc: 2    Pain Goal: Patients Stated Pain Goal: 3 (10/20/19 1326)                 Caren Macadam

## 2019-11-10 DIAGNOSIS — R945 Abnormal results of liver function studies: Secondary | ICD-10-CM | POA: Diagnosis not present

## 2019-11-10 DIAGNOSIS — E669 Obesity, unspecified: Secondary | ICD-10-CM | POA: Diagnosis not present

## 2019-11-10 DIAGNOSIS — K219 Gastro-esophageal reflux disease without esophagitis: Secondary | ICD-10-CM | POA: Diagnosis not present

## 2019-11-10 DIAGNOSIS — R1030 Lower abdominal pain, unspecified: Secondary | ICD-10-CM | POA: Diagnosis not present

## 2020-02-28 DIAGNOSIS — N760 Acute vaginitis: Secondary | ICD-10-CM | POA: Diagnosis not present

## 2020-02-28 DIAGNOSIS — Z01419 Encounter for gynecological examination (general) (routine) without abnormal findings: Secondary | ICD-10-CM | POA: Diagnosis not present

## 2020-02-28 DIAGNOSIS — Z124 Encounter for screening for malignant neoplasm of cervix: Secondary | ICD-10-CM | POA: Diagnosis not present

## 2020-02-28 DIAGNOSIS — Z309 Encounter for contraceptive management, unspecified: Secondary | ICD-10-CM | POA: Diagnosis not present

## 2020-02-28 DIAGNOSIS — Z113 Encounter for screening for infections with a predominantly sexual mode of transmission: Secondary | ICD-10-CM | POA: Diagnosis not present

## 2020-08-02 IMAGING — US US ABDOMEN COMPLETE
1 series · 14 of 25 positions shown · non-contrast
Comparison: None.

CLINICAL DATA: Patient with elevated LFTs.  Abdominal pain.

EXAM:
ABDOMEN ULTRASOUND COMPLETE

[Series 1: us abdomen complete · 0.18mm/px · 14 of 85 slices shown]
[im 1/85]
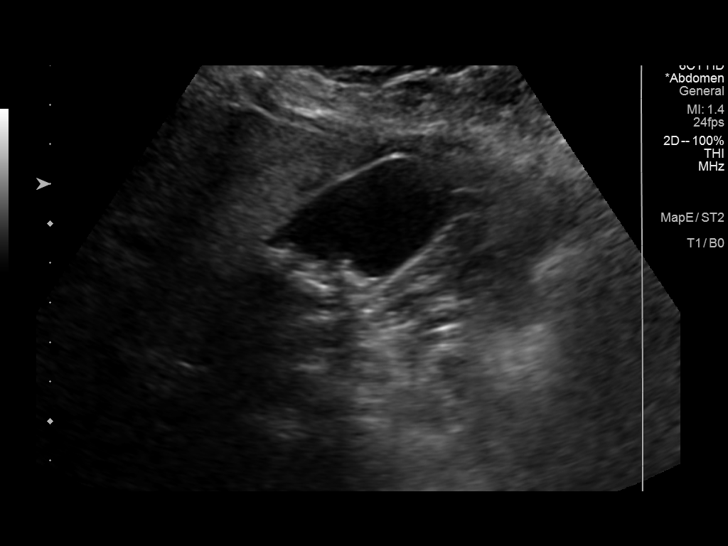
[im 8/85]
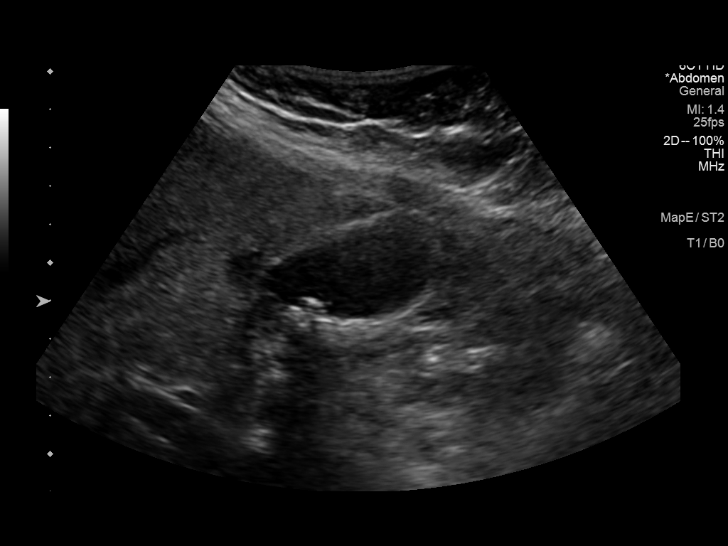
[im 15/85]
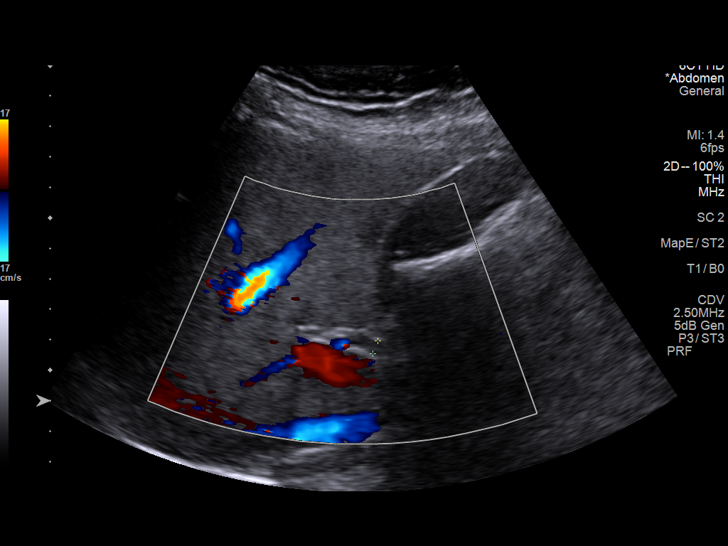
[im 22/85]
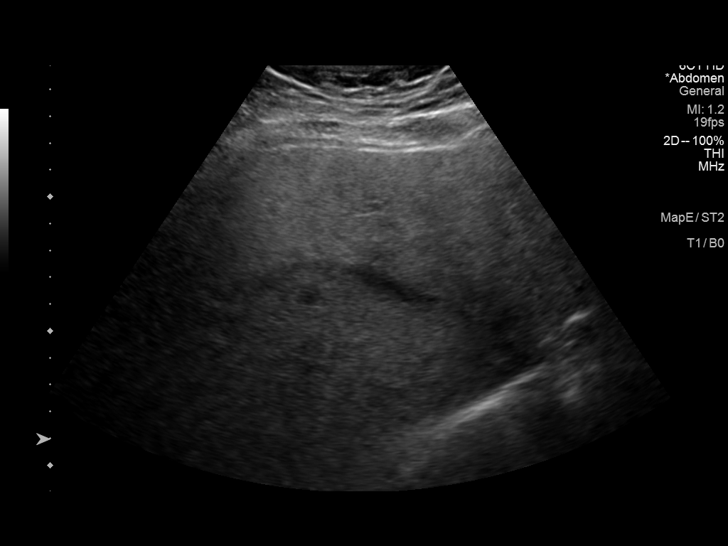
[im 29/85]
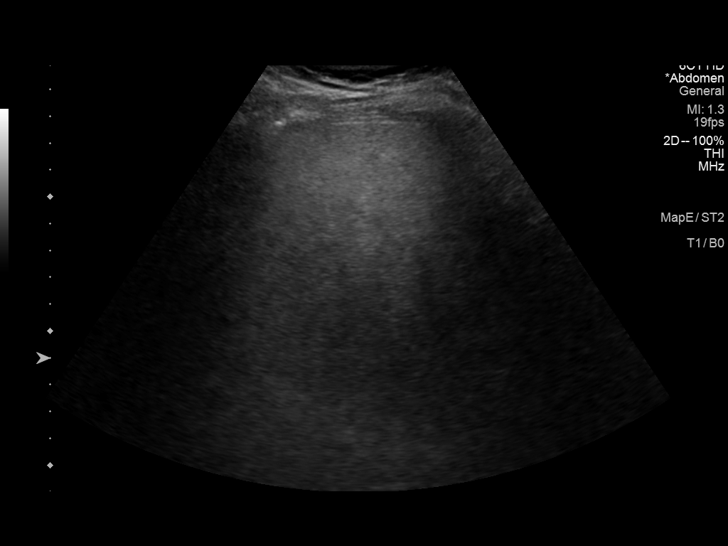
[im 32/85]
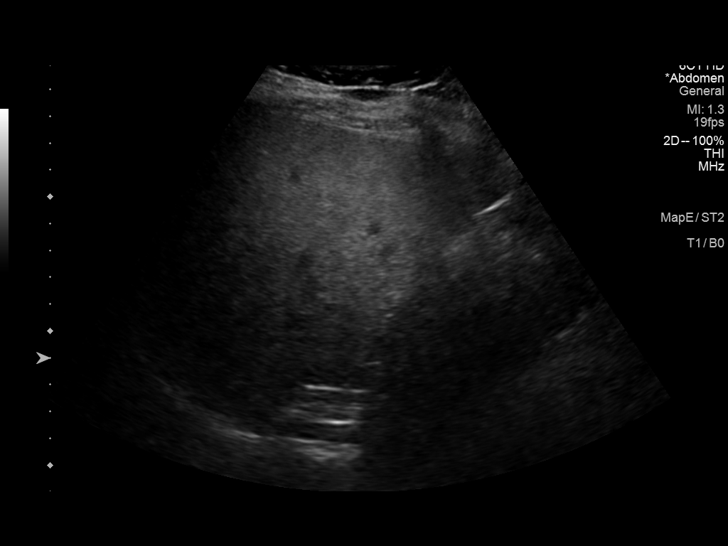
[im 39/85]
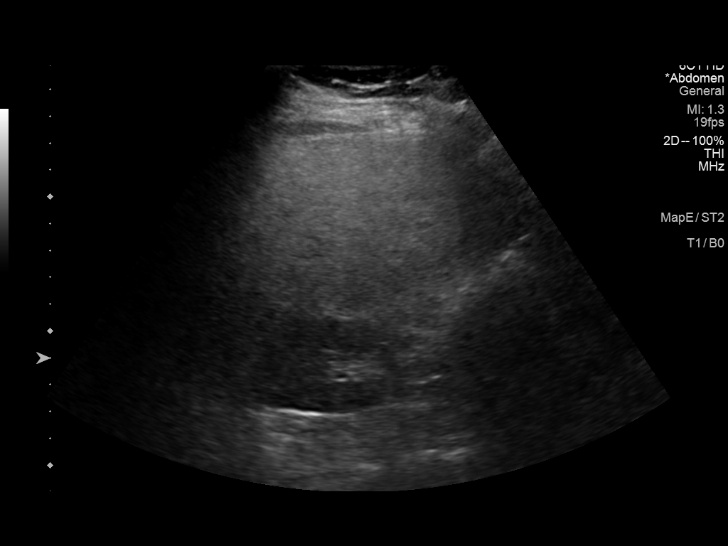
[im 46/85]
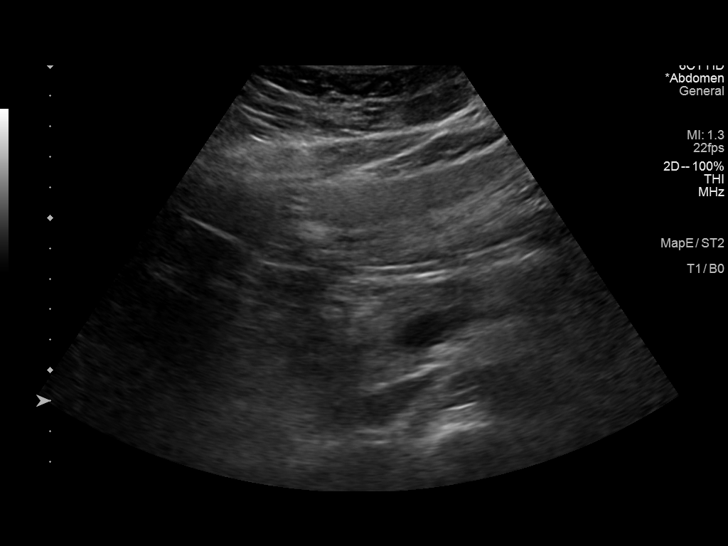
[im 53/85]
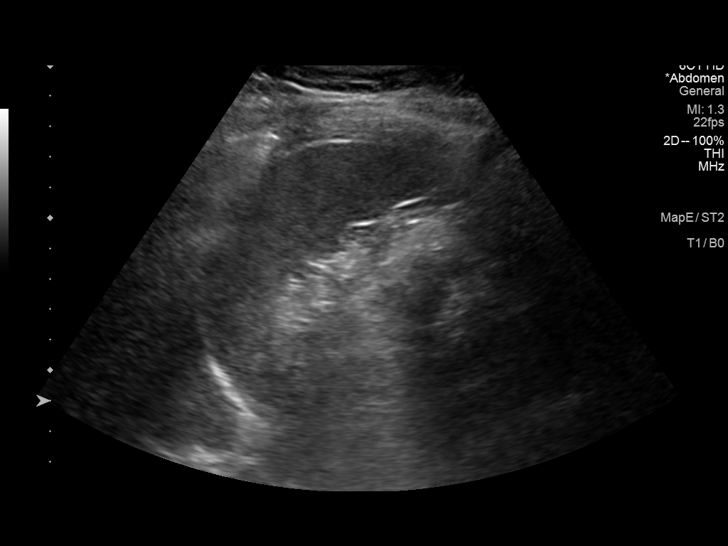
[im 57/85]
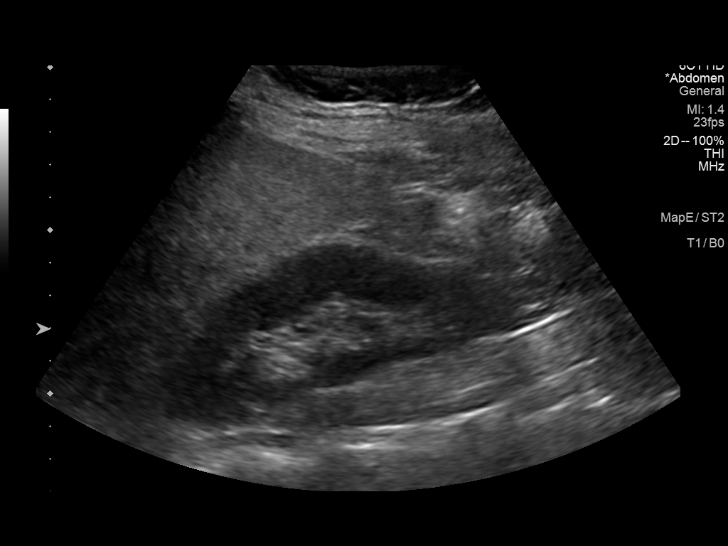
[im 64/85]
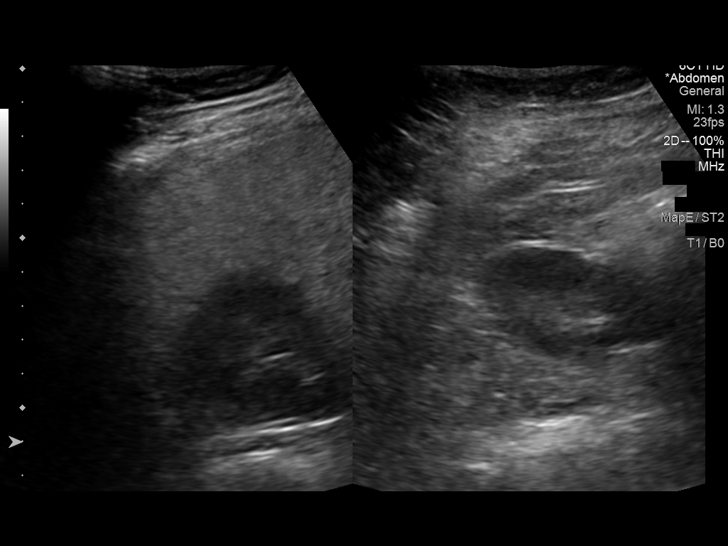
[im 71/85]
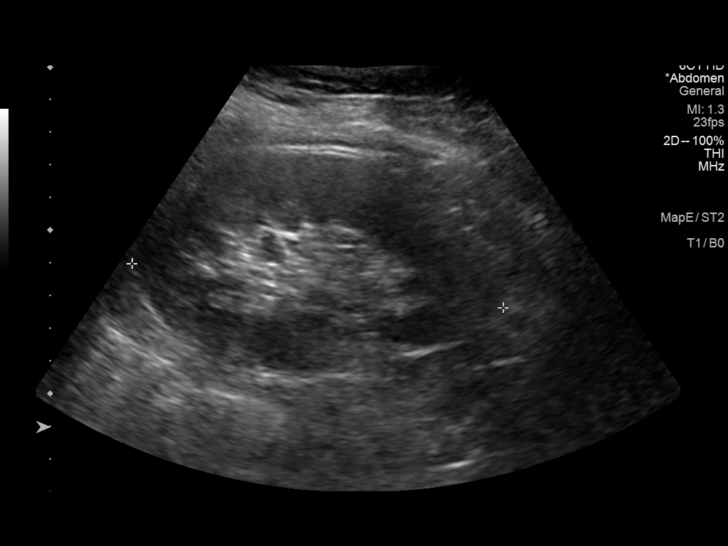
[im 78/85]
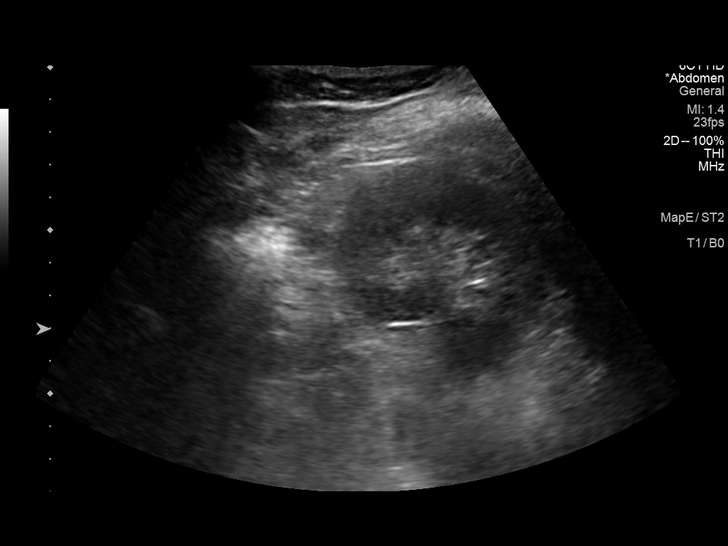
[im 85/85]
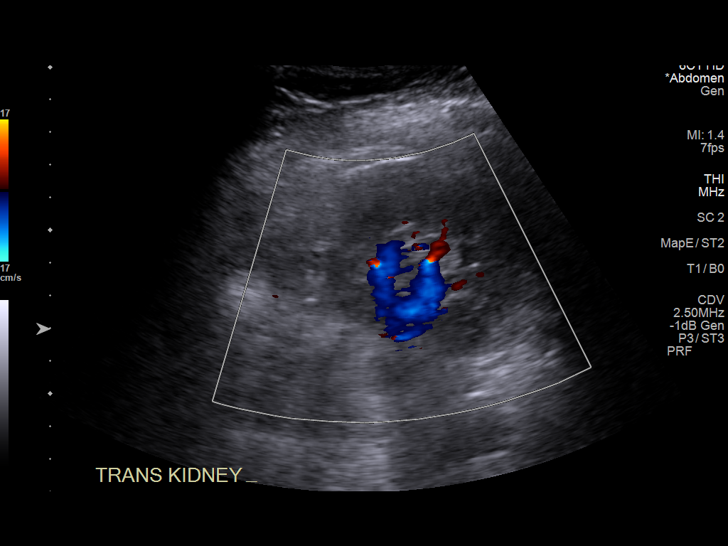

[14 of 25 positions shown; findings below may reference images not displayed]

FINDINGS: Gallbladder: Multiple stones within the gallbladder lumen. No
gallbladder wall thickening or pericholecystic fluid. Negative
sonographic Murphy sign.

Common bile duct: Diameter: 5 mm

Liver: Increased in echogenicity. No focal mass identified. Portal
vein is patent on color Doppler imaging with normal direction of
blood flow towards the liver.

IVC: No abnormality visualized.

Pancreas: Visualized portion unremarkable.

Spleen: Size and appearance within normal limits.

Right Kidney: Length: 11.3 cm. Echogenicity within normal limits. No
mass or hydronephrosis visualized.

Left Kidney: Length: 11.4 cm. Echogenicity within normal limits. No
mass or hydronephrosis visualized.

Abdominal aorta: No aneurysm visualized.

Other findings: None.
IMPRESSION: Cholelithiasis without secondary signs to suggest acute
cholecystitis.

Hepatic steatosis.

## 2020-10-14 IMAGING — RF DG CHOLANGIOGRAM OPERATIVE
1 series · 4 of 4 positions shown · non-contrast
Comparison: None.

CLINICAL DATA: 23-year-old female with a history of cholelithiasis

EXAM:
INTRAOPERATIVE CHOLANGIOGRAM
TECHNIQUE: Cholangiographic images from the C-arm fluoroscopic device were
submitted for interpretation post-operatively. Please see the
procedural report for the amount of contrast and the fluoroscopy
time utilized.

[Series 1: run · 4 of 84 frames shown]
[frame 3/84]
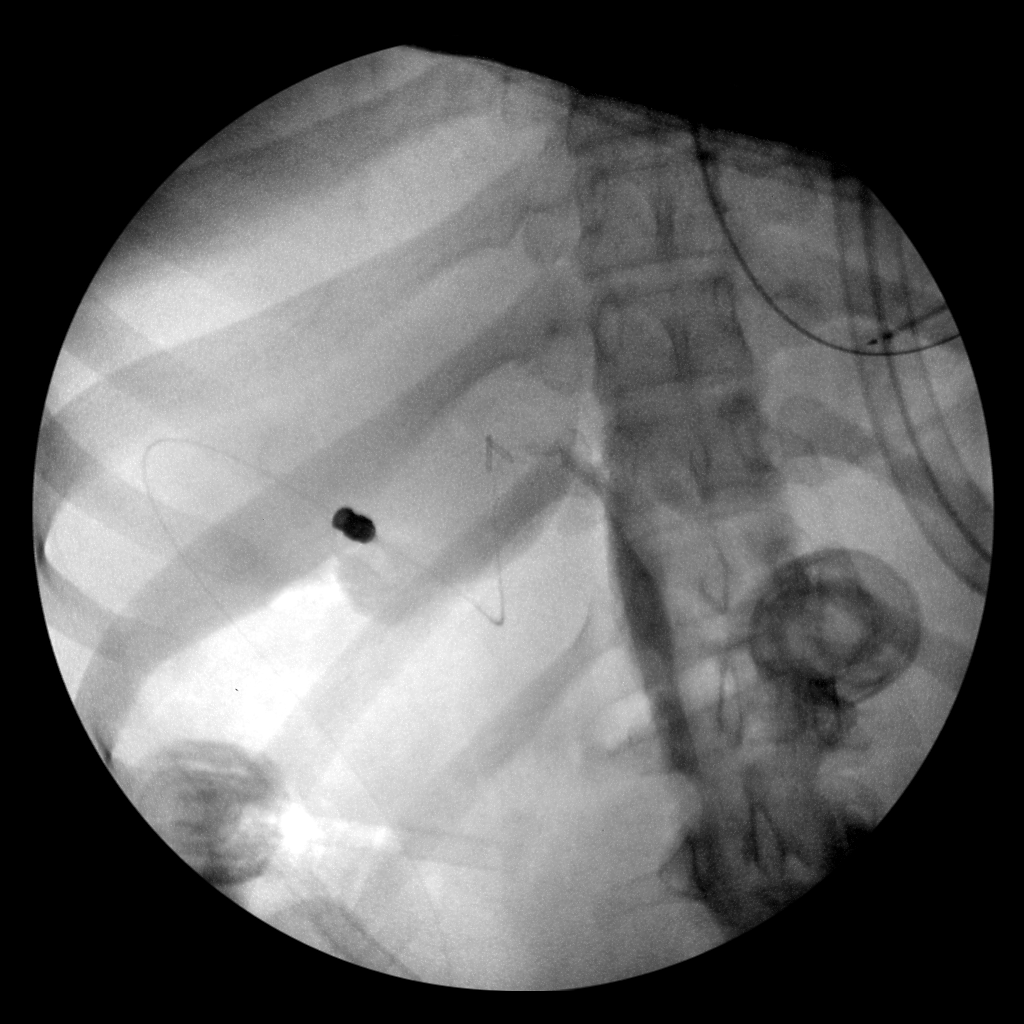
[frame 13/84]
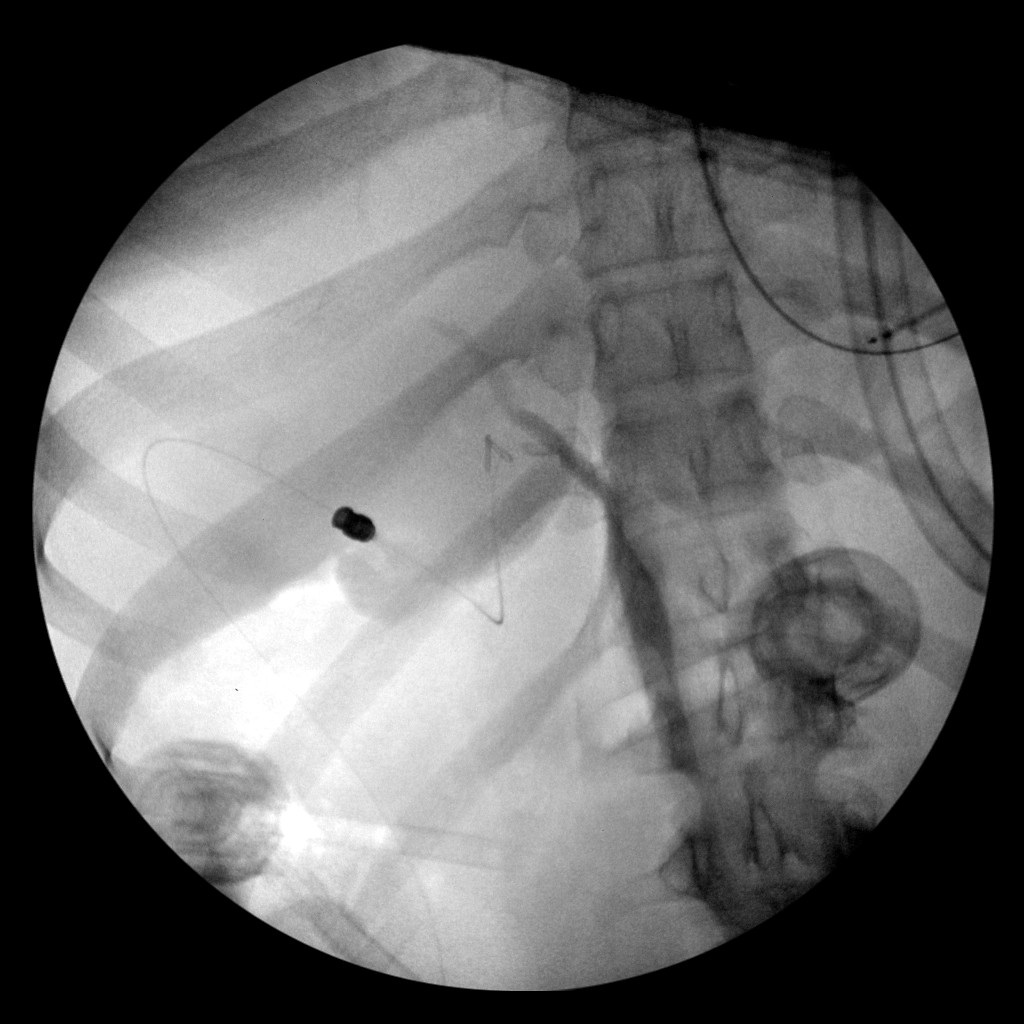
[frame 43/84]
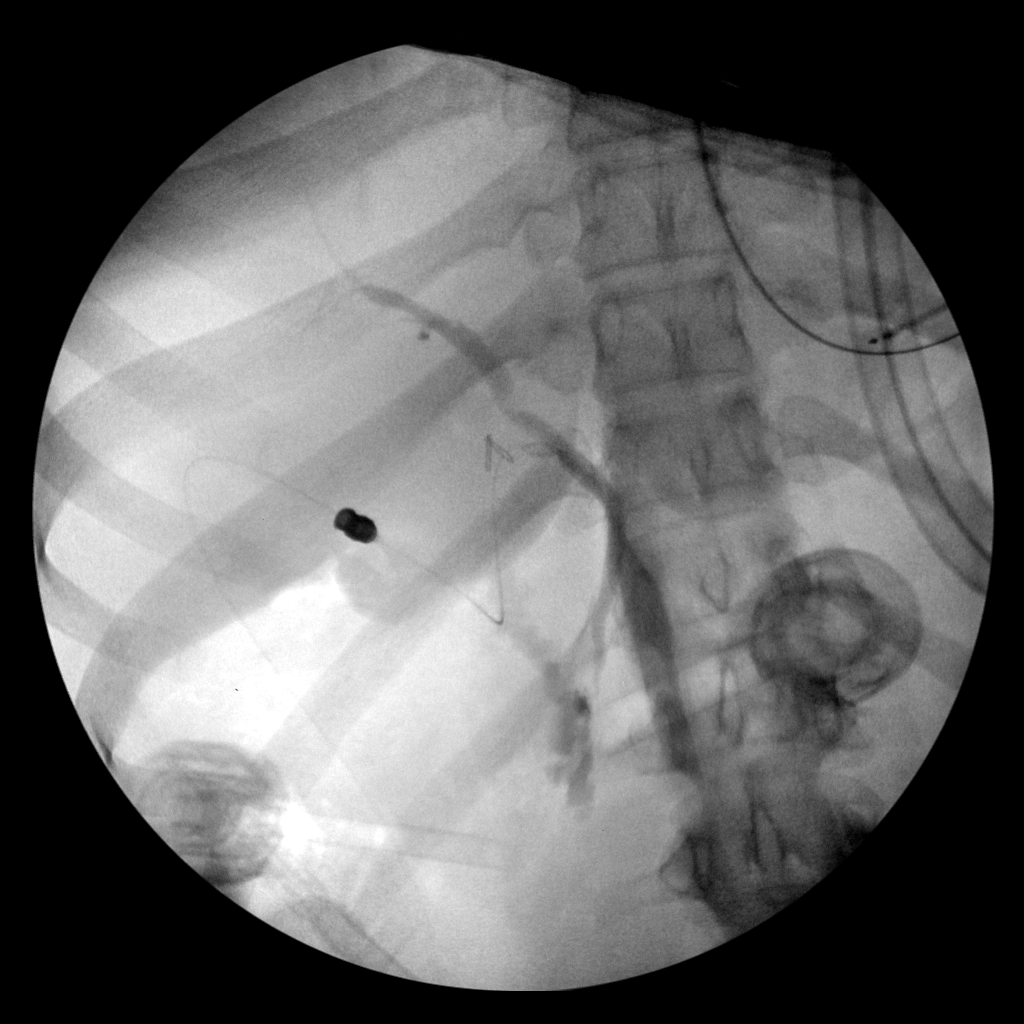
[frame 72/84]
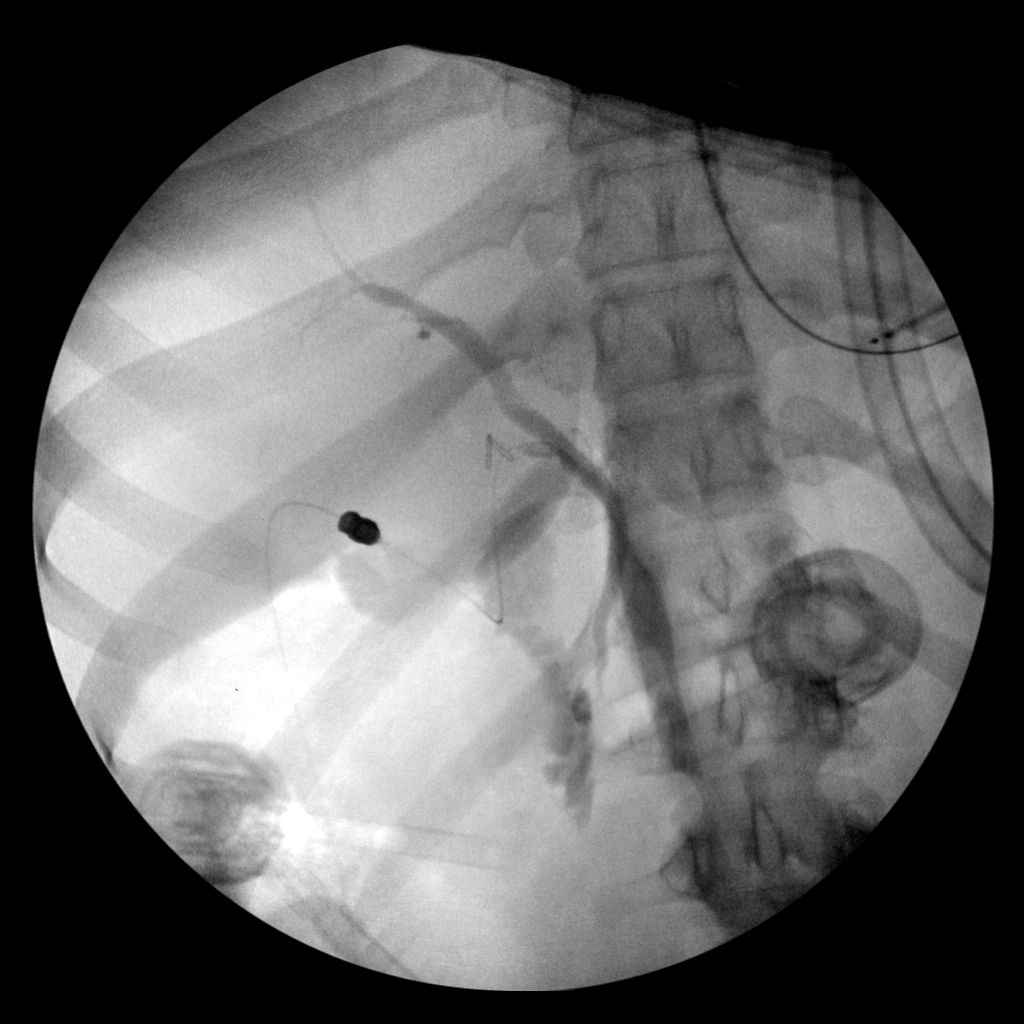

[4 of 4 positions shown; findings below may reference images not displayed]

FINDINGS: Surgical instruments project over the upper abdomen.

There is cannulation of the cystic duct/gallbladder neck, with
antegrade infusion of contrast. Caliber of the extrahepatic ductal
system within normal limits.

No definite filling defect within the extrahepatic ducts identified.

Free flow of contrast across the ampulla.
IMPRESSION: Intraoperative cholangiogram demonstrates extrahepatic biliary ducts
of unremarkable caliber, with no definite filling defects
identified. Free flow of contrast across the ampulla.

Please refer to the dictated operative report for full details of
intraoperative findings and procedure

## 2020-10-30 DIAGNOSIS — Z207 Contact with and (suspected) exposure to pediculosis, acariasis and other infestations: Secondary | ICD-10-CM | POA: Diagnosis not present

## 2021-01-20 DIAGNOSIS — J069 Acute upper respiratory infection, unspecified: Secondary | ICD-10-CM | POA: Diagnosis not present

## 2021-01-20 DIAGNOSIS — J019 Acute sinusitis, unspecified: Secondary | ICD-10-CM | POA: Diagnosis not present

## 2021-01-20 DIAGNOSIS — J309 Allergic rhinitis, unspecified: Secondary | ICD-10-CM | POA: Diagnosis not present

## 2021-02-10 DIAGNOSIS — Z23 Encounter for immunization: Secondary | ICD-10-CM | POA: Diagnosis not present

## 2021-05-16 DIAGNOSIS — Z309 Encounter for contraceptive management, unspecified: Secondary | ICD-10-CM | POA: Diagnosis not present

## 2021-05-16 DIAGNOSIS — Z01419 Encounter for gynecological examination (general) (routine) without abnormal findings: Secondary | ICD-10-CM | POA: Diagnosis not present

## 2021-07-09 DIAGNOSIS — B370 Candidal stomatitis: Secondary | ICD-10-CM | POA: Diagnosis not present

## 2021-07-09 DIAGNOSIS — E669 Obesity, unspecified: Secondary | ICD-10-CM | POA: Diagnosis not present

## 2021-07-09 DIAGNOSIS — B351 Tinea unguium: Secondary | ICD-10-CM | POA: Diagnosis not present

## 2021-07-09 DIAGNOSIS — Z833 Family history of diabetes mellitus: Secondary | ICD-10-CM | POA: Diagnosis not present

## 2021-07-10 DIAGNOSIS — Z833 Family history of diabetes mellitus: Secondary | ICD-10-CM | POA: Diagnosis not present

## 2021-07-10 DIAGNOSIS — R7301 Impaired fasting glucose: Secondary | ICD-10-CM | POA: Diagnosis not present

## 2021-07-10 DIAGNOSIS — B351 Tinea unguium: Secondary | ICD-10-CM | POA: Diagnosis not present

## 2021-07-17 DIAGNOSIS — B370 Candidal stomatitis: Secondary | ICD-10-CM | POA: Diagnosis not present

## 2021-07-17 DIAGNOSIS — B351 Tinea unguium: Secondary | ICD-10-CM | POA: Diagnosis not present

## 2021-08-04 DIAGNOSIS — K219 Gastro-esophageal reflux disease without esophagitis: Secondary | ICD-10-CM | POA: Diagnosis not present

## 2021-08-04 DIAGNOSIS — R1013 Epigastric pain: Secondary | ICD-10-CM | POA: Diagnosis not present

## 2022-05-22 DIAGNOSIS — Z01419 Encounter for gynecological examination (general) (routine) without abnormal findings: Secondary | ICD-10-CM | POA: Diagnosis not present

## 2022-05-22 DIAGNOSIS — N760 Acute vaginitis: Secondary | ICD-10-CM | POA: Diagnosis not present
